# Patient Record
Sex: Female | Born: 1960 | Race: White | Hispanic: No | Smoking: Former smoker
Health system: Southern US, Community
[De-identification: ages and names within clinical notes are randomized; demographics above are authoritative.]

## PROBLEM LIST (undated history)

## (undated) ENCOUNTER — Emergency Department (HOSPITAL_COMMUNITY): Admission: EM | Payer: Managed Care, Other (non HMO)

## (undated) DIAGNOSIS — A6000 Herpesviral infection of urogenital system, unspecified: Secondary | ICD-10-CM

## (undated) DIAGNOSIS — I10 Essential (primary) hypertension: Secondary | ICD-10-CM

## (undated) DIAGNOSIS — B009 Herpesviral infection, unspecified: Secondary | ICD-10-CM

## (undated) DIAGNOSIS — K635 Polyp of colon: Secondary | ICD-10-CM

## (undated) DIAGNOSIS — R51 Headache: Secondary | ICD-10-CM

## (undated) DIAGNOSIS — R519 Headache, unspecified: Secondary | ICD-10-CM

## (undated) DIAGNOSIS — J302 Other seasonal allergic rhinitis: Secondary | ICD-10-CM

## (undated) DIAGNOSIS — E785 Hyperlipidemia, unspecified: Secondary | ICD-10-CM

## (undated) DIAGNOSIS — B019 Varicella without complication: Secondary | ICD-10-CM

## (undated) DIAGNOSIS — N946 Dysmenorrhea, unspecified: Secondary | ICD-10-CM

## (undated) DIAGNOSIS — N941 Unspecified dyspareunia: Secondary | ICD-10-CM

## (undated) DIAGNOSIS — N920 Excessive and frequent menstruation with regular cycle: Secondary | ICD-10-CM

## (undated) DIAGNOSIS — D219 Benign neoplasm of connective and other soft tissue, unspecified: Secondary | ICD-10-CM

## (undated) DIAGNOSIS — N952 Postmenopausal atrophic vaginitis: Secondary | ICD-10-CM

## (undated) DIAGNOSIS — N809 Endometriosis, unspecified: Secondary | ICD-10-CM

## (undated) HISTORY — DX: Varicella without complication: B01.9

## (undated) HISTORY — DX: Hyperlipidemia, unspecified: E78.5

## (undated) HISTORY — DX: Postmenopausal atrophic vaginitis: N95.2

## (undated) HISTORY — PX: TUBAL LIGATION: SHX77

## (undated) HISTORY — DX: Endometriosis, unspecified: N80.9

## (undated) HISTORY — DX: Benign neoplasm of connective and other soft tissue, unspecified: D21.9

## (undated) HISTORY — DX: Herpesviral infection, unspecified: B00.9

## (undated) HISTORY — DX: Polyp of colon: K63.5

## (undated) HISTORY — DX: Dysmenorrhea, unspecified: N94.6

## (undated) HISTORY — DX: Excessive and frequent menstruation with regular cycle: N92.0

## (undated) HISTORY — DX: Headache: R51

## (undated) HISTORY — DX: Unspecified dyspareunia: N94.10

## (undated) HISTORY — DX: Essential (primary) hypertension: I10

## (undated) HISTORY — DX: Other seasonal allergic rhinitis: J30.2

## (undated) HISTORY — DX: Herpesviral infection of urogenital system, unspecified: A60.00

## (undated) HISTORY — DX: Headache, unspecified: R51.9

---

## 1985-11-07 HISTORY — PX: KNEE DISLOCATION SURGERY: SHX689

## 1997-11-07 HISTORY — PX: ABDOMINAL HYSTERECTOMY: SHX81

## 2004-09-02 ENCOUNTER — Ambulatory Visit: Payer: Self-pay | Admitting: Obstetrics and Gynecology

## 2005-11-21 ENCOUNTER — Ambulatory Visit: Payer: Self-pay | Admitting: Obstetrics and Gynecology

## 2006-11-29 ENCOUNTER — Ambulatory Visit: Payer: Self-pay | Admitting: Obstetrics and Gynecology

## 2009-03-31 ENCOUNTER — Ambulatory Visit: Payer: Self-pay | Admitting: Obstetrics and Gynecology

## 2010-04-06 ENCOUNTER — Ambulatory Visit: Payer: Self-pay | Admitting: Obstetrics and Gynecology

## 2010-11-07 HISTORY — PX: OTHER SURGICAL HISTORY: SHX169

## 2011-05-18 ENCOUNTER — Ambulatory Visit: Payer: Self-pay | Admitting: Obstetrics and Gynecology

## 2011-12-11 LAB — HM COLONOSCOPY

## 2011-12-12 ENCOUNTER — Ambulatory Visit: Payer: Self-pay | Admitting: Gastroenterology

## 2012-05-22 ENCOUNTER — Ambulatory Visit: Payer: Self-pay | Admitting: Obstetrics and Gynecology

## 2012-05-28 ENCOUNTER — Ambulatory Visit: Payer: Self-pay | Admitting: Obstetrics and Gynecology

## 2012-06-08 ENCOUNTER — Ambulatory Visit: Payer: Self-pay | Admitting: Unknown Physician Specialty

## 2013-05-07 LAB — HM MAMMOGRAPHY

## 2013-05-28 ENCOUNTER — Ambulatory Visit: Payer: Self-pay | Admitting: Obstetrics and Gynecology

## 2013-09-24 ENCOUNTER — Ambulatory Visit: Payer: Self-pay | Admitting: Adult Health

## 2013-09-26 ENCOUNTER — Encounter: Payer: Self-pay | Admitting: Adult Health

## 2013-09-26 ENCOUNTER — Ambulatory Visit (INDEPENDENT_AMBULATORY_CARE_PROVIDER_SITE_OTHER): Payer: 59 | Admitting: Adult Health

## 2013-09-26 VITALS — BP 166/90 | HR 103 | Temp 97.8°F | Resp 14 | Ht 64.0 in | Wt 161.0 lb

## 2013-09-26 DIAGNOSIS — Z0000 Encounter for general adult medical examination without abnormal findings: Secondary | ICD-10-CM

## 2013-09-26 DIAGNOSIS — F341 Dysthymic disorder: Secondary | ICD-10-CM

## 2013-09-26 DIAGNOSIS — E7800 Pure hypercholesterolemia, unspecified: Secondary | ICD-10-CM

## 2013-09-26 DIAGNOSIS — R5383 Other fatigue: Secondary | ICD-10-CM

## 2013-09-26 DIAGNOSIS — I10 Essential (primary) hypertension: Secondary | ICD-10-CM

## 2013-09-26 DIAGNOSIS — F418 Other specified anxiety disorders: Secondary | ICD-10-CM | POA: Insufficient documentation

## 2013-09-26 DIAGNOSIS — R5381 Other malaise: Secondary | ICD-10-CM

## 2013-09-26 DIAGNOSIS — R079 Chest pain, unspecified: Secondary | ICD-10-CM

## 2013-09-26 MED ORDER — HYDROCHLOROTHIAZIDE 25 MG PO TABS: ORAL_TABLET | ORAL | Status: AC

## 2013-09-26 MED ORDER — LISINOPRIL 10 MG PO TABS: 10.0000 mg | ORAL_TABLET | Freq: Every day | ORAL | Status: AC

## 2013-09-26 MED ORDER — ALPRAZOLAM 0.25 MG PO TABS: ORAL_TABLET | ORAL | Status: AC

## 2013-09-26 MED ORDER — FLUOXETINE HCL 20 MG PO TABS: 20.0000 mg | ORAL_TABLET | Freq: Every day | ORAL | Status: AC

## 2013-09-26 NOTE — Progress Notes (Signed)
Pre visit review using our clinic review tool, if applicable. No additional management support is needed unless otherwise documented below in the visit note. 

## 2013-09-26 NOTE — Assessment & Plan Note (Addendum)
Her diagnosed with lymphoma in October 2013 and her son committed suicide in January 2014. She has attended grief counseling through hospice of [address]. Denies suicidal, homicidal ideations. Start Prozac 20 mg daily. Xanax 0.25 mg daily when necessary for anxiety/panic attack. Followup in one week. Note, greater than 60 minutes were spent in face-to-face communication with patient in the education, assessment, evaluation, planning and implementation of care pertaining to multiple problems discussed during visit.

## 2013-09-26 NOTE — Assessment & Plan Note (Signed)
Suspect symptoms may be related to depression. Check TSH and CBC

## 2013-09-26 NOTE — Assessment & Plan Note (Signed)
Patient reports chest tightness occurring when she feels anxious or having panic attack. EKG shows a normal sinus rhythm.

## 2013-09-26 NOTE — Progress Notes (Signed)
Subjective:    Patient ID: Carolyn Mann, female    DOB: 01/25/1961, 52 y.o.   MRN: [id number]  HPI  Patient is a pleasant 52 y/o female who presents to clinic to establish care. She was previously followed by Dr. Beckey Downing at [facility] in [place]. She was also followed by Dr. Logan Bores for her GYN. She was also followed by Dr. Lavenia Atlas. I will request medical records. Carolyn Mann reports that she has been going to a weight loss clinic. She has been taking phentermine and topamax. Recently, in October, she was noted to be hypertensive 161/112. The weight loss center recommended she be seen by PCP. Blood pressure has remained elevated.    Past Medical History  Diagnosis Date  . Chicken pox   . Frequent headaches   . Hyperlipidemia   . Hypertension   . Seasonal allergies   . HSV-2 (herpes simplex virus 2) infection   . Colon polyps     Colonoscopy 12/2011 - Repeat in 2023     Past Surgical History  Procedure Laterality Date  . Abdominal hysterectomy  1999    Cervix remains  . Knee dislocation surgery Left 1987  . Index finger bone spur Right 2012  . Left middle finger ganolin cyst Left 2012     Family History  Problem Relation Age of Onset  . Arthritis Mother   . Cancer Mother 24    breast cancer - died  . Hypertension Mother   . Cancer Maternal Grandmother   . Stroke Maternal Grandmother   . Cancer Other   . Hypertension Father   . Heart disease Father 51    CAD - MI died  . Lymphoma Daughter   . Depression Son   . Early death Son     suicide     History   Social History  . Marital Status: Divorced    Spouse Name: N/A    Number of Children: 3  . Years of Education: 13   Occupational History  . Project [employer]   Social History Main Topics  . Smoking status: Former Smoker -- 4 years    Quit date: 09/23/1985  . Smokeless tobacco: Not on file  . Alcohol Use: 0.6 oz/week    1 Glasses of wine per week  . Drug Use: No  . Sexual Activity: Not on file    Other Topics Concern  . Not on file   Social History Narrative   Carolyn Mann grew up in [place], [place]. She is divorced. She has 3 children (2 daughters and 1 son). Her son is deceased. She has 3 dogs. She rescues great danes. She also has 2 cats. Carolyn Mann has been working for [employer] for 19 years. She enjoys spending times with her dogs. She also enjoys photography and spending time with her grandchildren.      Review of Systems  Constitutional: Positive for appetite change and fatigue.  Respiratory: Positive for chest tightness.   Cardiovascular: Negative for palpitations and leg swelling.  Gastrointestinal: Negative.   Endocrine: Negative.   Genitourinary: Negative.   Musculoskeletal:       Joint stiffness. She has been evaluated by Dr. Gavin Potters   Skin: Negative.   Allergic/Immunologic:       Seasonal allergies. Cat allergies  Neurological: Positive for dizziness and headaches. Negative for tremors, seizures, syncope, speech difficulty and numbness.       Room spins with changing positions quickly  Psychiatric/Behavioral: Positive for sleep disturbance. Negative for suicidal ideas,  behavioral problems and agitation. The patient is nervous/anxious.        Depression for several years. Worsened with daughter's diagnosis of lymphoma and son's suicide in January 2014. Isolation.       Objective:   Physical Exam  Constitutional: She is oriented to person, place, and time. She appears well-developed and well-nourished. No distress.  Emotional distress  HENT:  Head: Normocephalic and atraumatic.  Right Ear: External ear normal.  Left Ear: External ear normal.  Nose: Nose normal.  Mouth/Throat: Oropharynx is clear and moist.  Eyes: Conjunctivae and EOM are normal. Pupils are equal, round, and reactive to light.  Neck: Normal range of motion. Neck supple. No tracheal deviation present. No thyromegaly present.  Cardiovascular: Normal rate, regular rhythm, normal heart sounds and intact  distal pulses.  Exam reveals no gallop and no friction rub.   No murmur heard. Pulmonary/Chest: Effort normal and breath sounds normal. No respiratory distress. She has no wheezes. She has no rales.  Abdominal: Soft. Bowel sounds are normal. She exhibits no distension and no mass. There is no tenderness. There is no rebound and no guarding.  Musculoskeletal: Normal range of motion. She exhibits no edema and no tenderness.  Lymphadenopathy:    She has no cervical adenopathy.  Neurological: She is alert and oriented to person, place, and time. She has normal reflexes. No cranial nerve deficit. Coordination normal.  Skin: Skin is warm and dry.  Psychiatric: She has a normal mood and affect. Her behavior is normal. Judgment and thought content normal.          Assessment & Plan:

## 2013-09-26 NOTE — Patient Instructions (Addendum)
Please return for fasting labs at your earliest convenience.  For your high blood pressure I am starting you on:   Lisinopril 10 mg daily  HCTZ (hydrochlorothiazide) 25 mg tablet take 1/2 tablet daily in the morning  For Depression with anxiety I am starting you on:   Prozac 20 mg daily in the morning  Xanax 0.25 mg daily as needed for panic attack  Return for follow up in 1 week

## 2013-09-26 NOTE — Assessment & Plan Note (Signed)
Normal physical exam excluding breast, pelvic/Pap which was done earlier this year. Check labs: CBC with differential, basic metabolic panel, hepatic panel, TSH, vitamin D, B12. EKG reveals normal sinus rhythm.

## 2013-09-26 NOTE — Assessment & Plan Note (Signed)
Check lipids 

## 2013-09-26 NOTE — Assessment & Plan Note (Signed)
Stop phentermine. Start lisinopril 10 mg and HCTZ 12.5 mg. Check basic metabolic panel. Return for followup in one week

## 2013-09-30 ENCOUNTER — Telehealth: Payer: Self-pay | Admitting: Adult Health

## 2013-09-30 NOTE — Telephone Encounter (Signed)
Appt tomorrow with Raquel 

## 2013-09-30 NOTE — Telephone Encounter (Signed)
Called pt, she was on the phone with the triage nurse, scheduling an appointment with Raquel for tomorrow

## 2013-09-30 NOTE — Telephone Encounter (Signed)
Return call placed to patient at [phone] for triage assessment for complaints of Rapid heart rate and headache since starting new medication. No answer. Voicemail message left for patient to return call to office number.

## 2013-09-30 NOTE — Telephone Encounter (Signed)
Patient Information:  Caller Name: [place]  Phone: [phone]  Patient: Carolyn Mann, Carolyn Mann  Gender: Female  DOB: November 28, 1960  Age: 52 Years  PCP: Orville Govern  Pregnant: No  Office Follow Up:  Does the office need to follow up with this patient?: No  Instructions For The Office: N/A  RN Note:  Posterior headache rated 7/10. Currently working. Currently scheduled for follow up 10/02/13.  Advised to see MD 10/01/13.  Will come in fasting for lab work.  Symptoms  Reason For Call & Symptoms: Posterior or frontal headache beginning 09/28/13, one day after beginning four new medications.  After shower this morning, felt like a "voodoo doll" with pin-like stabbing sensation all over body with nausea.  The prickling resolved with rest.  Coworker in lab noted irregular pulse in the 80's.  Reviewed Health History In EMR: Yes  Reviewed Medications In EMR: Yes  Reviewed Allergies In EMR: Yes  Reviewed Surgeries / Procedures: Yes  Date of Onset of Symptoms: 09/28/2013  Treatments Tried: Buffered ASA and Ibuprofen for headache  Treatments Tried Worked: Yes OB / GYN:  LMP: Unknown  Guideline(s) Used:  Headache  Disposition Per Guideline:   See Today or Tomorrow in Office  Reason For Disposition Reached:   Unexplained headache that is present > 24 hours  Advice Given:  Pain Medicines:  For pain relief, you can take either acetaminophen, ibuprofen, or naproxen.  Acetaminophen (e.g., Tylenol):  Regular Strength Tylenol: Take 650 mg (two 325 mg pills) by mouth every 4-6 hours as needed. Each Regular Strength Tylenol pill has 325 mg of acetaminophen.  Extra Strength Tylenol: Take 1,000 mg (two 500 mg pills) every 8 hours as needed. Each Extra Strength Tylenol pill has 500 mg of acetaminophen.  The most you should take each day is 3,000 mg (10 Regular Strength or 6 Extra Strength pills a day).  Naproxen (e.g., Aleve):  Take 220 mg (one 220 mg pill) by mouth every 8 hours as needed. You may take  440 mg (two 220 mg pills) for your first dose.  The most you should take each day is 660 mg (three 220 mg pills a day), unless your doctor has told you to take more.  Call Back If:  Headache lasts longer than 24 hours  You become worse.  Patient Will Follow Care Advice:  YES  Appointment Scheduled:  10/01/2013 09:15:00 Appointment Scheduled Provider:  Orville Govern

## 2013-10-01 ENCOUNTER — Ambulatory Visit (INDEPENDENT_AMBULATORY_CARE_PROVIDER_SITE_OTHER): Payer: 59 | Admitting: Adult Health

## 2013-10-01 ENCOUNTER — Ambulatory Visit: Payer: Self-pay | Admitting: Adult Health

## 2013-10-01 ENCOUNTER — Encounter: Payer: Self-pay | Admitting: Adult Health

## 2013-10-01 VITALS — BP 120/82 | HR 100 | Temp 97.8°F | Resp 14 | Wt 159.0 lb

## 2013-10-01 DIAGNOSIS — R519 Headache, unspecified: Secondary | ICD-10-CM | POA: Insufficient documentation

## 2013-10-01 DIAGNOSIS — F341 Dysthymic disorder: Secondary | ICD-10-CM

## 2013-10-01 DIAGNOSIS — I10 Essential (primary) hypertension: Secondary | ICD-10-CM

## 2013-10-01 DIAGNOSIS — F418 Other specified anxiety disorders: Secondary | ICD-10-CM

## 2013-10-01 DIAGNOSIS — Z0000 Encounter for general adult medical examination without abnormal findings: Secondary | ICD-10-CM

## 2013-10-01 DIAGNOSIS — R51 Headache: Secondary | ICD-10-CM

## 2013-10-01 LAB — LIPID PANEL
Cholesterol: 211 mg/dL — ABNORMAL HIGH (ref 0–200)
Total CHOL/HDL Ratio: 3
Triglycerides: 81 mg/dL (ref 0.0–149.0)

## 2013-10-01 LAB — CBC WITH DIFFERENTIAL/PLATELET
Basophils Absolute: 0.1 10*3/uL (ref 0.0–0.1)
Basophils Relative: 0.5 % (ref 0.0–3.0)
Eosinophils Absolute: 0.1 10*3/uL (ref 0.0–0.7)
Eosinophils Relative: 0.8 % (ref 0.0–5.0)
HCT: 43.9 % (ref 36.0–46.0)
Lymphocytes Relative: 23.4 % (ref 12.0–46.0)
Lymphs Abs: 2.5 10*3/uL (ref 0.7–4.0)
MCHC: 33.1 g/dL (ref 30.0–36.0)
MCV: 81.4 fl (ref 78.0–100.0)
Monocytes Absolute: 0.8 10*3/uL (ref 0.1–1.0)
Neutrophils Relative %: 67.9 % (ref 43.0–77.0)
Platelets: 465 10*3/uL — ABNORMAL HIGH (ref 150.0–400.0)
RDW: 14.2 % (ref 11.5–14.6)
WBC: 10.9 10*3/uL — ABNORMAL HIGH (ref 4.5–10.5)

## 2013-10-01 LAB — BASIC METABOLIC PANEL
CO2: 30 mEq/L (ref 19–32)
Calcium: 9.8 mg/dL (ref 8.4–10.5)
Chloride: 97 mEq/L (ref 96–112)
Glucose, Bld: 94 mg/dL (ref 70–99)
Sodium: 134 mEq/L — ABNORMAL LOW (ref 135–145)

## 2013-10-01 LAB — HEPATIC FUNCTION PANEL
ALT: 16 U/L (ref 0–35)
AST: 18 U/L (ref 0–37)
Alkaline Phosphatase: 74 U/L (ref 39–117)
Bilirubin, Direct: 0.2 mg/dL (ref 0.0–0.3)
Total Bilirubin: 1.1 mg/dL (ref 0.3–1.2)

## 2013-10-01 LAB — VITAMIN B12: Vitamin B-12: >1500 pg/mL — ABNORMAL HIGH (ref 211–911)

## 2013-10-01 LAB — HEMOGLOBIN A1C: Hgb A1c MFr Bld: 5.4 % (ref 4.6–6.5)

## 2013-10-01 NOTE — Assessment & Plan Note (Addendum)
Controlled on lisinopril 10 mg and HCTZ 12.5 mg. Continue meds. Bmet. Follow.

## 2013-10-01 NOTE — Progress Notes (Signed)
Pre visit review using our clinic review tool, if applicable. No additional management support is needed unless otherwise documented below in the visit note. 

## 2013-10-01 NOTE — Assessment & Plan Note (Signed)
Appears sinus related. Will try Dymista spray. Excedrin Migraine. Has been taking naproxen but wonder if having rebound HA. If no resolution consider scan prior to starting triptans.

## 2013-10-01 NOTE — Progress Notes (Signed)
  Subjective:    Patient ID: Carolyn Mann, female    DOB: 1961/04/05, 52 y.o.   MRN: [id number]  Headache  Associated symptoms include sinus pressure. Pertinent negatives include no fever or neck pain.    Pt is a pleasant 52 yo female, presents to clinic with reports of headache x 3 days.  She describes the headache as pressure in the front of her head radiating towards the back of her head.  Pt feels it is likely sinus pressure but has avoided decongestants due to her blood pressure.  She is also here for follow up HTN. She has been taking her medication daily. Also reports improvement with her xanax.   Current Outpatient Prescriptions on File Prior to Visit  Medication Sig Dispense Refill  . ALPRAZolam (XANAX) 0.25 MG tablet Take 1 tablet daily as needed for anxiety  30 tablet  0  . FLUoxetine (PROZAC) 20 MG tablet Take 1 tablet (20 mg total) by mouth daily.  30 tablet  3  . hydrochlorothiazide (HYDRODIURIL) 25 MG tablet Take 1/2 tablet daily.  30 tablet  3  . lisinopril (PRINIVIL,ZESTRIL) 10 MG tablet Take 1 tablet (10 mg total) by mouth daily.  30 tablet  3  . topiramate (TOPAMAX) 50 MG tablet Take 50 mg by mouth daily.        No current facility-administered medications on file prior to visit.     Review of Systems  Constitutional: Negative for fever and chills.  HENT: Positive for sinus pressure.   Musculoskeletal: Negative for neck pain and neck stiffness.  Neurological: Positive for headaches.  Psychiatric/Behavioral: The patient is nervous/anxious.        Improved anxiety       Objective:   Physical Exam  Constitutional: She is oriented to person, place, and time. She appears well-developed and well-nourished. No distress.  HENT:  Nose: Right sinus exhibits frontal sinus tenderness. Left sinus exhibits frontal sinus tenderness.  Eyes: Conjunctivae and EOM are normal. Pupils are equal, round, and reactive to light.  Cardiovascular: Normal rate, regular rhythm and intact  distal pulses.  Exam reveals no gallop and no friction rub.   No murmur heard. Pulmonary/Chest: Effort normal and breath sounds normal. No respiratory distress. She has no wheezes. She has no rales.  Neurological: She is alert and oriented to person, place, and time.  Psychiatric: She has a normal mood and affect. Her speech is normal and behavior is normal. Thought content normal. Cognition and memory are normal.    BP 120/82  Pulse 100  Temp(Src) 97.8 F (36.6 C) (Oral)  Resp 14  Wt 159 lb (72.122 kg)  SpO2 98%       Assessment & Plan:

## 2013-10-01 NOTE — Assessment & Plan Note (Signed)
Patient reports anxiety improved. Is taking xanax daily. Will try to take only prn. Continue to follow.

## 2013-10-01 NOTE — Patient Instructions (Signed)
  Start Dymista nasal spray - one spray into each nostril twice a day.  Continue sinus irrigation.  Try Excedrin migraine. You may take it with a cup of coffee.  If headaches are not resolved or persist we will consider doing a scan.

## 2013-10-02 ENCOUNTER — Ambulatory Visit: Payer: 59 | Admitting: Adult Health

## 2013-10-02 LAB — LDL CHOLESTEROL, DIRECT: Direct LDL: 125 mg/dL

## 2013-10-07 ENCOUNTER — Ambulatory Visit (INDEPENDENT_AMBULATORY_CARE_PROVIDER_SITE_OTHER): Payer: 59 | Admitting: Adult Health

## 2013-10-07 ENCOUNTER — Encounter: Payer: Self-pay | Admitting: Adult Health

## 2013-10-07 VITALS — BP 122/80 | HR 90 | Temp 98.5°F | Resp 16 | Wt 163.0 lb

## 2013-10-07 DIAGNOSIS — R899 Unspecified abnormal finding in specimens from other organs, systems and tissues: Secondary | ICD-10-CM

## 2013-10-07 DIAGNOSIS — R6889 Other general symptoms and signs: Secondary | ICD-10-CM

## 2013-10-07 DIAGNOSIS — R42 Dizziness and giddiness: Secondary | ICD-10-CM | POA: Insufficient documentation

## 2013-10-07 LAB — BASIC METABOLIC PANEL
CO2: 29 mEq/L (ref 19–32)
Calcium: 9.6 mg/dL (ref 8.4–10.5)
GFR: 60.59 mL/min (ref >60.00)
Glucose, Bld: 86 mg/dL (ref 70–99)
Potassium: 4 mEq/L (ref 3.5–5.1)
Sodium: 138 mEq/L (ref 135–145)

## 2013-10-07 NOTE — Patient Instructions (Signed)
  Hold the fluid pill (HCTZ) for now.  Continue to take the lisinopril 10 mg daily. If symptoms continue, try cutting this pill in half.  Please have your blood work drawn prior to leaving the office.  I will notify you once the results are available.

## 2013-10-07 NOTE — Progress Notes (Signed)
Pre visit review using our clinic review tool, if applicable. No additional management support is needed unless otherwise documented below in the visit note. 

## 2013-10-07 NOTE — Assessment & Plan Note (Signed)
Hold HCTZ. Continue lisinopril 10 mg. Check bmet. If symptoms persist will try lisinopril 5 mg daily.

## 2013-10-07 NOTE — Progress Notes (Signed)
   Subjective:    Patient ID: Carolyn Mann, female    DOB: 1961/06/03, 52 y.o.   MRN: [id number]  HPI  Patient is a pleasant 52 y/o female with hx of HTN recently started on lisinopril 10 mg and HCTZ 12.5 mg. She has been experiencing dizziness and feeling like she is going to pass out. Her electrolytes had shown a slightly low sodium and mild dehydration. Her blood pressure has been doing very well.  Current Outpatient Prescriptions on File Prior to Visit  Medication Sig Dispense Refill  . ALPRAZolam (XANAX) 0.25 MG tablet Take 1 tablet daily as needed for anxiety  30 tablet  0  . FLUoxetine (PROZAC) 20 MG tablet Take 1 tablet (20 mg total) by mouth daily.  30 tablet  3  . hydrochlorothiazide (HYDRODIURIL) 25 MG tablet Take 1/2 tablet daily.  30 tablet  3  . lisinopril (PRINIVIL,ZESTRIL) 10 MG tablet Take 1 tablet (10 mg total) by mouth daily.  30 tablet  3  . topiramate (TOPAMAX) 50 MG tablet Take 50 mg by mouth daily.        No current facility-administered medications on file prior to visit.     Review of Systems  Neurological: Positive for dizziness, light-headedness and headaches.       Objective:   Physical Exam  Constitutional: She is oriented to person, place, and time. She appears well-developed and well-nourished. No distress.  Cardiovascular: Normal rate, regular rhythm, normal heart sounds and intact distal pulses.  Exam reveals no gallop and no friction rub.   No murmur heard. Pulmonary/Chest: Effort normal and breath sounds normal. No respiratory distress. She has no wheezes. She has no rales.  Neurological: She is alert and oriented to person, place, and time.  Skin: Skin is warm and dry.  Psychiatric: She has a normal mood and affect. Her behavior is normal. Judgment and thought content normal.          Assessment & Plan:

## 2014-01-16 ENCOUNTER — Ambulatory Visit: Payer: Self-pay | Admitting: Pain Medicine

## 2014-02-03 ENCOUNTER — Ambulatory Visit: Payer: Self-pay | Admitting: Pain Medicine

## 2015-03-04 ENCOUNTER — Other Ambulatory Visit: Payer: Self-pay | Admitting: Obstetrics and Gynecology

## 2015-03-04 DIAGNOSIS — Z1231 Encounter for screening mammogram for malignant neoplasm of breast: Secondary | ICD-10-CM

## 2015-03-19 ENCOUNTER — Ambulatory Visit
Admission: RE | Admit: 2015-03-19 | Discharge: 2015-03-19 | Disposition: A | Discharge status: Home / Self Care | Payer: 59 | Source: Ambulatory Visit | Attending: Obstetrics and Gynecology | Admitting: Obstetrics and Gynecology

## 2015-03-19 DIAGNOSIS — Z1231 Encounter for screening mammogram for malignant neoplasm of breast: Secondary | ICD-10-CM | POA: Diagnosis present

## 2016-02-24 ENCOUNTER — Encounter: Payer: Self-pay | Admitting: Obstetrics and Gynecology

## 2017-02-14 ENCOUNTER — Other Ambulatory Visit: Payer: Self-pay | Admitting: Neurology

## 2017-02-14 DIAGNOSIS — R531 Weakness: Secondary | ICD-10-CM

## 2017-02-14 DIAGNOSIS — R200 Anesthesia of skin: Secondary | ICD-10-CM

## 2017-02-23 ENCOUNTER — Ambulatory Visit
Admission: RE | Admit: 2017-02-23 | Discharge: 2017-02-23 | Disposition: A | Discharge status: Home / Self Care | Payer: 59 | Source: Ambulatory Visit | Attending: Neurology | Admitting: Neurology

## 2017-02-23 DIAGNOSIS — R600 Localized edema: Secondary | ICD-10-CM | POA: Diagnosis not present

## 2017-02-23 DIAGNOSIS — R200 Anesthesia of skin: Secondary | ICD-10-CM | POA: Diagnosis not present

## 2017-02-23 DIAGNOSIS — R531 Weakness: Secondary | ICD-10-CM | POA: Insufficient documentation

## 2017-03-14 ENCOUNTER — Encounter (INDEPENDENT_AMBULATORY_CARE_PROVIDER_SITE_OTHER): Payer: 59 | Admitting: Vascular Surgery

## 2017-03-23 ENCOUNTER — Other Ambulatory Visit: Payer: Self-pay | Admitting: Physician Assistant

## 2017-03-23 DIAGNOSIS — Z1231 Encounter for screening mammogram for malignant neoplasm of breast: Secondary | ICD-10-CM

## 2017-04-21 ENCOUNTER — Ambulatory Visit
Admission: RE | Admit: 2017-04-21 | Discharge: 2017-04-21 | Disposition: A | Discharge status: Home / Self Care | Payer: 59 | Source: Ambulatory Visit | Attending: Physician Assistant | Admitting: Physician Assistant

## 2017-04-21 DIAGNOSIS — Z1231 Encounter for screening mammogram for malignant neoplasm of breast: Secondary | ICD-10-CM

## 2018-05-29 ENCOUNTER — Other Ambulatory Visit: Payer: Self-pay | Admitting: Physician Assistant

## 2018-05-29 DIAGNOSIS — Z1231 Encounter for screening mammogram for malignant neoplasm of breast: Secondary | ICD-10-CM

## 2018-06-20 ENCOUNTER — Ambulatory Visit
Admission: RE | Admit: 2018-06-20 | Discharge: 2018-06-20 | Disposition: A | Discharge status: Home / Self Care | Payer: Managed Care, Other (non HMO) | Source: Ambulatory Visit | Attending: Physician Assistant | Admitting: Physician Assistant

## 2018-06-20 DIAGNOSIS — Z1231 Encounter for screening mammogram for malignant neoplasm of breast: Secondary | ICD-10-CM | POA: Diagnosis present

## 2018-11-30 ENCOUNTER — Other Ambulatory Visit (HOSPITAL_COMMUNITY): Payer: Self-pay | Admitting: Physical Medicine and Rehabilitation

## 2018-11-30 ENCOUNTER — Other Ambulatory Visit: Payer: Self-pay | Admitting: Physical Medicine and Rehabilitation

## 2018-11-30 DIAGNOSIS — M5416 Radiculopathy, lumbar region: Secondary | ICD-10-CM

## 2018-12-08 ENCOUNTER — Ambulatory Visit
Admission: RE | Admit: 2018-12-08 | Discharge: 2018-12-08 | Disposition: A | Discharge status: Home / Self Care | Payer: 59 | Source: Ambulatory Visit | Attending: Physical Medicine and Rehabilitation | Admitting: Physical Medicine and Rehabilitation

## 2018-12-08 DIAGNOSIS — M5416 Radiculopathy, lumbar region: Secondary | ICD-10-CM | POA: Diagnosis not present

## 2019-04-08 ENCOUNTER — Other Ambulatory Visit: Payer: Self-pay | Admitting: Physician Assistant

## 2019-04-08 DIAGNOSIS — Z1231 Encounter for screening mammogram for malignant neoplasm of breast: Secondary | ICD-10-CM

## 2020-01-10 ENCOUNTER — Ambulatory Visit: Payer: Managed Care, Other (non HMO) | Attending: Internal Medicine

## 2020-01-10 DIAGNOSIS — Z23 Encounter for immunization: Secondary | ICD-10-CM | POA: Insufficient documentation

## 2020-01-10 NOTE — Progress Notes (Signed)
   Covid-19 Vaccination Clinic  Name:  VENOLA FELLING    MRN: [id number] DOB: [birth year]  01/10/2020  Ms. Behrle was observed post Covid-19 immunization for 15 minutes without incident. She was provided with Vaccine Information Sheet and instruction to access the V-Safe system.   Ms. Takada was instructed to call 911 with any severe reactions post vaccine: Marland Kitchen Difficulty breathing  . Swelling of face and throat  . A fast heartbeat  . A bad rash all over body  . Dizziness and weakness   Immunizations Administered    Name Date Dose VIS Date Route   Pfizer COVID-19 Vaccine 01/10/2020 10:36 AM 0.3 mL 10/18/2019 Intramuscular   Manufacturer: [place]   Lot: [id number]   [place]: [id number]

## 2020-01-31 ENCOUNTER — Ambulatory Visit: Payer: 59 | Attending: Internal Medicine

## 2020-01-31 DIAGNOSIS — Z23 Encounter for immunization: Secondary | ICD-10-CM

## 2020-01-31 NOTE — Progress Notes (Signed)
   Covid-19 Vaccination Clinic  Name:  MERIDITH TOWELL    MRN: [id number] DOB: 01-20-1961  01/31/2020  Ms. Curtain was observed post Covid-19 immunization for 15 minutes without incident. She was provided with Vaccine Information Sheet and instruction to access the V-Safe system.   Ms. Gaal was instructed to call 911 with any severe reactions post vaccine: Marland Kitchen Difficulty breathing  . Swelling of face and throat  . A fast heartbeat  . A bad rash all over body  . Dizziness and weakness   Immunizations Administered    Name Date Dose VIS Date Route   Pfizer COVID-19 Vaccine 01/31/2020  9:03 AM 0.3 mL 10/18/2019 Intramuscular   Manufacturer: [place]   Lot: [id number]   [place]: [id number]

## 2021-12-14 ENCOUNTER — Other Ambulatory Visit: Payer: Self-pay | Admitting: Physician Assistant

## 2021-12-14 DIAGNOSIS — Z1231 Encounter for screening mammogram for malignant neoplasm of breast: Secondary | ICD-10-CM

## 2021-12-28 ENCOUNTER — Other Ambulatory Visit: Payer: Self-pay

## 2021-12-28 ENCOUNTER — Ambulatory Visit
Admission: RE | Admit: 2021-12-28 | Discharge: 2021-12-28 | Disposition: A | Discharge status: Home / Self Care | Payer: No Typology Code available for payment source | Source: Ambulatory Visit | Attending: Physician Assistant | Admitting: Physician Assistant

## 2021-12-28 DIAGNOSIS — Z1231 Encounter for screening mammogram for malignant neoplasm of breast: Secondary | ICD-10-CM | POA: Diagnosis not present

## 2022-12-05 ENCOUNTER — Ambulatory Visit
Admission: RE | Admit: 2022-12-05 | Discharge: 2022-12-05 | Disposition: A | Discharge status: Home / Self Care | Payer: No Typology Code available for payment source | Attending: Gastroenterology | Admitting: Gastroenterology

## 2022-12-05 ENCOUNTER — Encounter
Admission: RE | Disposition: A | Discharge status: Home / Self Care | Payer: Self-pay | Source: Home / Self Care | Attending: Gastroenterology

## 2022-12-05 ENCOUNTER — Encounter: Payer: Self-pay | Admitting: Gastroenterology

## 2022-12-05 ENCOUNTER — Ambulatory Visit: Payer: No Typology Code available for payment source | Admitting: General Practice

## 2022-12-05 DIAGNOSIS — Z1211 Encounter for screening for malignant neoplasm of colon: Secondary | ICD-10-CM | POA: Insufficient documentation

## 2022-12-05 DIAGNOSIS — Z8619 Personal history of other infectious and parasitic diseases: Secondary | ICD-10-CM | POA: Diagnosis not present

## 2022-12-05 DIAGNOSIS — Z8379 Family history of other diseases of the digestive system: Secondary | ICD-10-CM | POA: Diagnosis not present

## 2022-12-05 DIAGNOSIS — K5730 Diverticulosis of large intestine without perforation or abscess without bleeding: Secondary | ICD-10-CM | POA: Insufficient documentation

## 2022-12-05 DIAGNOSIS — I10 Essential (primary) hypertension: Secondary | ICD-10-CM | POA: Insufficient documentation

## 2022-12-05 DIAGNOSIS — Z90710 Acquired absence of both cervix and uterus: Secondary | ICD-10-CM | POA: Insufficient documentation

## 2022-12-05 DIAGNOSIS — K640 First degree hemorrhoids: Secondary | ICD-10-CM | POA: Insufficient documentation

## 2022-12-05 DIAGNOSIS — D124 Benign neoplasm of descending colon: Secondary | ICD-10-CM | POA: Insufficient documentation

## 2022-12-05 DIAGNOSIS — Z86010 Personal history of colonic polyps: Secondary | ICD-10-CM | POA: Insufficient documentation

## 2022-12-05 DIAGNOSIS — E785 Hyperlipidemia, unspecified: Secondary | ICD-10-CM | POA: Insufficient documentation

## 2022-12-05 DIAGNOSIS — Z800 Family history of malignant neoplasm of digestive organs: Secondary | ICD-10-CM | POA: Diagnosis not present

## 2022-12-05 DIAGNOSIS — D122 Benign neoplasm of ascending colon: Secondary | ICD-10-CM | POA: Insufficient documentation

## 2022-12-05 HISTORY — PX: COLONOSCOPY WITH PROPOFOL: SHX5780

## 2022-12-05 SURGERY — COLONOSCOPY WITH PROPOFOL
Anesthesia: General

## 2022-12-05 MED ORDER — PROPOFOL 500 MG/50ML IV EMUL
INTRAVENOUS | Status: DC | PRN
  Administered 2022-12-05: 150 ug/kg/min via INTRAVENOUS

## 2022-12-05 MED ORDER — PROPOFOL 1000 MG/100ML IV EMUL
INTRAVENOUS | Status: AC
  Filled 2022-12-05: qty 100

## 2022-12-05 MED ORDER — LIDOCAINE HCL (CARDIAC) PF 100 MG/5ML IV SOSY
PREFILLED_SYRINGE | INTRAVENOUS | Status: DC | PRN
  Administered 2022-12-05: 100 mg via INTRAVENOUS

## 2022-12-05 MED ORDER — PROPOFOL 10 MG/ML IV BOLUS
INTRAVENOUS | Status: DC | PRN
  Administered 2022-12-05: 20 mg via INTRAVENOUS
  Administered 2022-12-05: 70 mg via INTRAVENOUS

## 2022-12-05 MED ORDER — EPHEDRINE 5 MG/ML INJ
INTRAVENOUS | Status: AC
  Filled 2022-12-05: qty 5

## 2022-12-05 MED ORDER — SODIUM CHLORIDE 0.9 % IV SOLN: INTRAVENOUS | Status: DC

## 2022-12-05 MED ORDER — LIDOCAINE HCL (PF) 2 % IJ SOLN
INTRAMUSCULAR | Status: AC
  Filled 2022-12-05: qty 5

## 2022-12-05 NOTE — Transfer of Care (Signed)
Immediate Anesthesia Transfer of Care Note  Patient: Carolyn Mann  Procedure(s) Performed: COLONOSCOPY WITH PROPOFOL  Patient Location: PACU  Anesthesia Type:General  Level of Consciousness: awake, alert , and oriented  Airway & Oxygen Therapy: Patient Spontanous Breathing  Post-op Assessment: Report given to RN and Post -op Vital signs reviewed and stable  Post vital signs: Reviewed and stable  Last Vitals:  Vitals Value Taken Time  BP    Temp    Pulse    Resp    SpO2      Last Pain:  Vitals:   12/05/22 1252  TempSrc: Temporal  PainSc: 0-No pain         Complications: No notable events documented.

## 2022-12-05 NOTE — Op Note (Signed)
[facility] Patient Name: Carolyn Mann Procedure Date: 12/05/2022 1:59 PM MRN: [id number] Account #: [id number] Date of Birth: 03/30/1961 Admit Type: Outpatient Age: 62 Room: [facility] ENDO ROOM 2 Gender: Female Note Status: Finalized Instrument Name: Peds Colonoscope [id number] Procedure:             Colonoscopy Indications:           High risk colon cancer surveillance: Personal history                         of colonic polyps Providers:             Rueben Bash, DO Referring MD:          Annamaria Helling DO, DO (Referring MD), Precious Bard, MD (Referring MD) Medicines:             Monitored Anesthesia Care Complications:         No immediate complications. Estimated blood loss:                         Minimal. Procedure:             Pre-Anesthesia Assessment:                        - Prior to the procedure, a History and Physical was                         performed, and patient medications and allergies were                         reviewed. The patient is competent. The risks and                         benefits of the procedure and the sedation options and                         risks were discussed with the patient. All questions                         were answered and informed consent was obtained.                         Patient identification and proposed procedure were                         verified by the physician, the nurse, the anesthetist                         and the technician in the endoscopy suite. Mental                         Status Examination: alert and oriented. Airway                         Examination: normal oropharyngeal airway and neck  mobility. Respiratory Examination: clear to                         auscultation. CV Examination: RRR, no murmurs, no S3                         or S4. Prophylactic Antibiotics: The patient does not                          require prophylactic antibiotics. Prior                         Anticoagulants: The patient has taken no anticoagulant                         or antiplatelet agents. ASA Grade Assessment: II - A                         patient with mild systemic disease. After reviewing                         the risks and benefits, the patient was deemed in                         satisfactory condition to undergo the procedure. The                         anesthesia plan was to use monitored anesthesia care                         (MAC). Immediately prior to administration of                         medications, the patient was re-assessed for adequacy                         to receive sedatives. The heart rate, respiratory                         rate, oxygen saturations, blood pressure, adequacy of                         pulmonary ventilation, and response to care were                         monitored throughout the procedure. The physical                         status of the patient was re-assessed after the                         procedure.                        After obtaining informed consent, the colonoscope was                         passed under direct vision. Throughout the procedure,  the patient's blood pressure, pulse, and oxygen                         saturations were monitored continuously. The                         Colonoscope was introduced through the anus and                         advanced to the the terminal ileum, with                         identification of the appendiceal orifice and IC                         valve. The colonoscopy was performed without                         difficulty. The patient tolerated the procedure well.                         The quality of the bowel preparation was evaluated                         using the BBPS [facility] Bowel Preparation Scale) with                         scores of: Right Colon = 3,  Transverse Colon = 3 and                         Left Colon = 3 (entire mucosa seen well with no                         residual staining, small fragments of stool or opaque                         liquid). The total BBPS score equals 9. The terminal                         ileum, ileocecal valve, appendiceal orifice, and                         rectum were photographed. Findings:      The perianal and digital rectal examinations were normal. Pertinent       negatives include normal sphincter tone.      The terminal ileum appeared normal. Estimated blood loss: none.      Multiple small-mouthed diverticula were found in the left colon.       Estimated blood loss: none.      Non-bleeding internal hemorrhoids were found during retroflexion. The       hemorrhoids were Grade I (internal hemorrhoids that do not prolapse).       Estimated blood loss: none.      Two sessile polyps were found in the descending colon. The polyps were 1       to 2 mm in size. These polyps were removed with a jumbo cold forceps.       Resection and retrieval were complete. Estimated blood  loss was minimal.      A 3 to 4 mm polyp was found in the ascending colon. The polyp was       sessile. The polyp was removed with a cold snare. Resection and       retrieval were complete. Estimated blood loss was minimal.      The exam was otherwise without abnormality on direct and retroflexion       views. Impression:            - The examined portion of the ileum was normal.                        - Diverticulosis in the left colon.                        - Non-bleeding internal hemorrhoids.                        - Two 1 to 2 mm polyps in the descending colon,                         removed with a jumbo cold forceps. Resected and                         retrieved.                        - One 3 to 4 mm polyp in the ascending colon, removed                         with a cold snare. Resected and retrieved.                         - The examination was otherwise normal on direct and                         retroflexion views. Recommendation:        - Patient has a contact number available for                         emergencies. The signs and symptoms of potential                         delayed complications were discussed with the patient.                         Return to normal activities tomorrow. Written                         discharge instructions were provided to the patient.                        - Discharge patient to home.                        - Resume previous diet.                        - Continue present medications.                        -  No aspirin, ibuprofen, naproxen, or other                         non-steroidal anti-inflammatory drugs for 5 days after                         polyp removal.                        - Await pathology results.                        - Repeat colonoscopy for surveillance based on                         pathology results.                        - Return to referring physician as previously                         scheduled.                        - The findings and recommendations were discussed with                         the patient. Procedure Code(s):     --- Professional ---                        [phone], Colonoscopy, flexible; with removal of                         tumor(s), polyp(s), or other lesion(s) by snare                         technique                        [id number], 31, Colonoscopy, flexible; with biopsy, single                         or multiple Diagnosis Code(s):     --- Professional ---                        Z86.010, Personal history of colonic polyps                        K64.0, First degree hemorrhoids                        D12.4, Benign neoplasm of descending colon                        D12.2, Benign neoplasm of ascending colon                        K57.30, Diverticulosis of large intestine without                          perforation or abscess without bleeding CPT copyright 2022 American Medical Association. All rights reserved. The  codes documented in this report are preliminary and upon coder review may  be revised to meet current compliance requirements. Attending Participation:      I personally performed the entire procedure. Volney American, DO Annamaria Helling DO, DO 12/05/2022 3:05:52 PM This report has been signed electronically. Number of Addenda: 0 Note Initiated On: 12/05/2022 1:59 PM Scope Withdrawal Time: 0 hours 16 minutes 6 seconds  Total Procedure Duration: 0 hours 19 minutes 35 seconds  Estimated Blood Loss:  Estimated blood loss was minimal.      [facility]

## 2022-12-05 NOTE — H&P (Signed)
Pre-Procedure H&P   Patient ID: Carolyn Mann is a 62 y.o. female.  Gastroenterology Provider: Annamaria Helling, DO  Referring Provider: Laurine Blazer, PA PCP: Caren Macadam, MD  Date: 12/05/2022  HPI [name] is a 62 y.o. female who presents today for Colonoscopy for Surveillance-personal history of colon polyps .  Patient last underwent colonoscopy in February 2013 with fair prep and 1 tubular adenoma in the ascending colon.  Sigmoid diverticulosis was noted.  Patient reports 2 bowel movements per day without melena or hematochezia.  She avoids trigger foods that can cause more urgent bowel movements  Patient is status post hysterectomy.  Most recent hemoglobin 13.2 MCV 82 platelets 398,000  Paternal grandmother with gastric cancer.  Mother with diverticulosis.  No family history of colon cancer or colon polyps   Past Medical History:  Diagnosis Date   Chicken pox    Colon polyps    Colonoscopy 12/2011 - Repeat in 2023   Dysmenorrhea    Dyspareunia in female    Endometriosis    Fibroid    Frequent headaches    Herpes genitalia    HSV-2 (herpes simplex virus 2) infection    Hyperlipidemia    Hypertension    Menorrhagia    Seasonal allergies    Vaginal atrophy     Past Surgical History:  Procedure Laterality Date   ABDOMINAL HYSTERECTOMY  1999   Cervix remains   index finger bone spur Right 2012   KNEE DISLOCATION SURGERY Left 1987   left middle finger ganolin cyst Left 2012   TUBAL LIGATION      Family History Paternal grandmother with gastric cancer.  Mother with diverticulosis.  No family history of colon cancer or colon polyps No h/o GI disease or malignancy  Review of Systems  Constitutional:  Negative for activity change, appetite change, chills, diaphoresis, fatigue, fever and unexpected weight change.  HENT:  Negative for trouble swallowing and voice change.   Respiratory:  Negative for shortness of breath and wheezing.    Cardiovascular:  Negative for chest pain, palpitations and leg swelling.  Gastrointestinal:  Negative for abdominal distention, abdominal pain, anal bleeding, blood in stool, constipation, diarrhea, nausea, rectal pain and vomiting.  Musculoskeletal:  Negative for arthralgias and myalgias.  Skin:  Negative for color change and pallor.  Neurological:  Negative for dizziness, syncope and weakness.  Psychiatric/Behavioral:  Negative for confusion.   All other systems reviewed and are negative.    Medications No current facility-administered medications on file prior to encounter.   Current Outpatient Medications on File Prior to Encounter  Medication Sig Dispense Refill   hydrochlorothiazide (HYDRODIURIL) 25 MG tablet Take 1/2 tablet daily. 30 tablet 3   lisinopril (PRINIVIL,ZESTRIL) 10 MG tablet Take 1 tablet (10 mg total) by mouth daily. 30 tablet 3   ALPRAZolam (XANAX) 0.25 MG tablet Take 1 tablet daily as needed for anxiety 30 tablet 0   FLUoxetine (PROZAC) 20 MG tablet Take 1 tablet (20 mg total) by mouth daily. 30 tablet 3   topiramate (TOPAMAX) 50 MG tablet Take 50 mg by mouth daily.       Pertinent medications related to GI and procedure were reviewed by me with the patient prior to the procedure   Current Facility-Administered Medications:    0.9 %  sodium chloride infusion, , Intravenous, Continuous, Annamaria Helling, DO, Last Rate: 20 mL/hr at 12/05/22 1401, Continued from Pre-op at 12/05/22 1401      Allergies  Allergen  Reactions   Codeine Other (See Comments)   Latex Other (See Comments)   Penicillins    Allergies were reviewed by me prior to the procedure  Objective   Body mass index is 39.96 kg/m. Vitals:   12/05/22 1252  BP: 127/65  Pulse: 94  Resp: 16  Temp: (!) 96.3 F (35.7 C)  TempSrc: Temporal  SpO2: 100%  Weight: 104.8 kg  Height: 5' 3.75" (1.619 m)     Physical Exam Vitals and nursing note reviewed.  Constitutional:      General:  She is not in acute distress.    Appearance: Normal appearance. She is obese. She is not ill-appearing, toxic-appearing or diaphoretic.  HENT:     Head: Normocephalic and atraumatic.     Nose: Nose normal.     Mouth/Throat:     Mouth: Mucous membranes are moist.     Pharynx: Oropharynx is clear.  Eyes:     General: No scleral icterus.    Extraocular Movements: Extraocular movements intact.  Cardiovascular:     Rate and Rhythm: Normal rate and regular rhythm.     Heart sounds: Normal heart sounds. No murmur heard.    No friction rub. No gallop.  Pulmonary:     Effort: Pulmonary effort is normal. No respiratory distress.     Breath sounds: Normal breath sounds. No wheezing, rhonchi or rales.  Abdominal:     General: Abdomen is flat. Bowel sounds are normal. There is no distension.     Palpations: Abdomen is soft.     Tenderness: There is no abdominal tenderness. There is no guarding or rebound.  Musculoskeletal:     Cervical back: Neck supple.     Right lower leg: No edema.     Left lower leg: No edema.  Skin:    General: Skin is warm and dry.     Coloration: Skin is not jaundiced or pale.  Neurological:     General: No focal deficit present.     Mental Status: She is alert and oriented to person, place, and time. Mental status is at baseline.  Psychiatric:        Mood and Affect: Mood normal.        Behavior: Behavior normal.        Thought Content: Thought content normal.        Judgment: Judgment normal.      Assessment:  [name] is a 62 y.o. female  who presents today for Colonoscopy for surveillance- phx colon polyps.  Plan:  Colonoscopy with possible intervention today  Colonoscopy with possible biopsy, control of bleeding, polypectomy, and interventions as necessary has been discussed with the patient/patient representative. Informed consent was obtained from the patient/patient representative after explaining the indication, nature, and risks of the  procedure including but not limited to death, bleeding, perforation, missed neoplasm/lesions, cardiorespiratory compromise, and reaction to medications. Opportunity for questions was given and appropriate answers were provided. Patient/patient representative has verbalized understanding is amenable to undergoing the procedure.   Annamaria Helling, DO  [facility]  Portions of the record may have been created with voice recognition software. Occasional wrong-word or 'sound-a-like' substitutions may have occurred due to the inherent limitations of voice recognition software.  Read the chart carefully and recognize, using context, where substitutions may have occurred.

## 2022-12-05 NOTE — Anesthesia Preprocedure Evaluation (Signed)
Anesthesia Evaluation  Patient identified by MRN, date of birth, ID band Patient awake    Reviewed: Allergy & Precautions, NPO status , Patient's Chart, lab work & pertinent test results  History of Anesthesia Complications Negative for: history of anesthetic complications  Airway Mallampati: III  TM Distance: >3 FB Neck ROM: full    Dental  (+) Chipped, Dental Advidsory Given   Pulmonary neg pulmonary ROS, neg COPD, former smoker   Pulmonary exam normal        Cardiovascular hypertension, On Medications (-) Past MI and (-) CABG negative cardio ROS Normal cardiovascular exam     Neuro/Psych  PSYCHIATRIC DISORDERS Anxiety Depression    negative neurological ROS     GI/Hepatic negative GI ROS, Neg liver ROS,,,  Endo/Other  negative endocrine ROS    Renal/GU negative Renal ROS  negative genitourinary   Musculoskeletal   Abdominal   Peds  Hematology negative hematology ROS (+)   Anesthesia Other Findings Past Medical History: No date: Chicken pox No date: Colon polyps     Comment:  Colonoscopy 12/2011 - Repeat in 2023 No date: Dysmenorrhea No date: Dyspareunia in female No date: Endometriosis No date: Fibroid No date: Frequent headaches No date: Herpes genitalia No date: HSV-2 (herpes simplex virus 2) infection No date: Hyperlipidemia No date: Hypertension No date: Menorrhagia No date: Seasonal allergies No date: Vaginal atrophy  Past Surgical History: 1999: ABDOMINAL HYSTERECTOMY     Comment:  Cervix remains 2012: index finger bone spur; Right 1987: KNEE DISLOCATION SURGERY; Left 2012: left middle finger ganolin cyst; Left No date: TUBAL LIGATION  BMI    Body Mass Index: 39.96 kg/m      Reproductive/Obstetrics negative OB ROS                             Anesthesia Physical Anesthesia Plan  ASA: 2  Anesthesia Plan: General   Post-op Pain Management: Minimal or no  pain anticipated   Induction: Intravenous  PONV Risk Score and Plan: 3 and Propofol infusion, TIVA and Ondansetron  Airway Management Planned: Nasal Cannula  Additional Equipment: None  Intra-op Plan:   Post-operative Plan:   Informed Consent: I have reviewed the patients History and Physical, chart, labs and discussed the procedure including the risks, benefits and alternatives for the proposed anesthesia with the patient or authorized representative who has indicated his/her understanding and acceptance.     Dental advisory given  Plan Discussed with: CRNA and Surgeon  Anesthesia Plan Comments: (Discussed risks of anesthesia with patient, including possibility of difficulty with spontaneous ventilation under anesthesia necessitating airway intervention, PONV, and rare risks such as cardiac or respiratory or neurological events, and allergic reactions. Discussed the role of CRNA in patient's perioperative care. Patient understands.)       Anesthesia Quick Evaluation

## 2022-12-05 NOTE — Anesthesia Postprocedure Evaluation (Signed)
Anesthesia Post Note  Patient: Carolyn Mann  Procedure(s) Performed: COLONOSCOPY WITH PROPOFOL  Patient location during evaluation: PACU Anesthesia Type: General Level of consciousness: awake and alert, oriented and patient cooperative Pain management: pain level controlled Vital Signs Assessment: post-procedure vital signs reviewed and stable Respiratory status: spontaneous breathing, nonlabored ventilation and respiratory function stable Cardiovascular status: blood pressure returned to baseline and stable Postop Assessment: adequate PO intake Anesthetic complications: no   No notable events documented.   Last Vitals:  Vitals:   12/05/22 1510 12/05/22 1530  BP: 123/78 128/88  Pulse: 83 80  Resp: (!) 22   Temp:    SpO2: 99%     Last Pain:  Vitals:   12/05/22 1500  TempSrc: Temporal  PainSc: 0-No pain                 Darrin Nipper

## 2022-12-05 NOTE — Anesthesia Procedure Notes (Signed)
Procedure Name: MAC Date/Time: 12/05/2022 2:29 PM  Performed by: Biagio Borg, CRNAPre-anesthesia Checklist: Patient identified, Emergency Drugs available, Suction available, Patient being monitored and Timeout performed Patient Re-evaluated:Patient Re-evaluated prior to induction Oxygen Delivery Method: Nasal cannula Induction Type: IV induction Placement Confirmation: positive ETCO2 and CO2 detector

## 2022-12-05 NOTE — Interval H&P Note (Signed)
History and Physical Interval Note: Preprocedure H&P from 12/05/22  was reviewed and there was no interval change after seeing and examining the patient.  Written consent was obtained from the patient after discussion of risks, benefits, and alternatives. Patient has consented to proceed with Colonoscopy with possible intervention   12/05/2022 2:27 PM  Carolyn Mann  has presented today for surgery, with the diagnosis of Z86.010  - History of colon polyps.  The various methods of treatment have been discussed with the patient and family. After consideration of risks, benefits and other options for treatment, the patient has consented to  Procedure(s): COLONOSCOPY WITH PROPOFOL (N/A) as a surgical intervention.  The patient's history has been reviewed, patient examined, no change in status, stable for surgery.  I have reviewed the patient's chart and labs.  Questions were answered to the patient's satisfaction.     Annamaria Helling

## 2022-12-06 ENCOUNTER — Encounter: Payer: Self-pay | Admitting: Gastroenterology

## 2022-12-07 LAB — SURGICAL PATHOLOGY

## 2023-08-07 ENCOUNTER — Other Ambulatory Visit: Payer: Self-pay | Admitting: Family Medicine

## 2023-08-07 DIAGNOSIS — E2839 Other primary ovarian failure: Secondary | ICD-10-CM

## 2023-08-07 DIAGNOSIS — Z87312 Personal history of (healed) stress fracture: Secondary | ICD-10-CM

## 2023-08-11 ENCOUNTER — Other Ambulatory Visit: Payer: Self-pay | Admitting: Family Medicine

## 2023-08-11 DIAGNOSIS — Z1231 Encounter for screening mammogram for malignant neoplasm of breast: Secondary | ICD-10-CM

## 2023-08-17 ENCOUNTER — Ambulatory Visit
Admission: RE | Admit: 2023-08-17 | Discharge: 2023-08-17 | Disposition: A | Discharge status: Home / Self Care | Payer: No Typology Code available for payment source | Source: Ambulatory Visit | Attending: Family Medicine | Admitting: Family Medicine

## 2023-08-17 DIAGNOSIS — Z1231 Encounter for screening mammogram for malignant neoplasm of breast: Secondary | ICD-10-CM | POA: Diagnosis present

## 2023-08-22 ENCOUNTER — Encounter: Payer: Self-pay | Admitting: Family Medicine

## 2023-08-23 ENCOUNTER — Other Ambulatory Visit: Payer: Self-pay | Admitting: Family Medicine

## 2023-08-23 DIAGNOSIS — R928 Other abnormal and inconclusive findings on diagnostic imaging of breast: Secondary | ICD-10-CM

## 2023-08-28 ENCOUNTER — Ambulatory Visit
Admission: RE | Admit: 2023-08-28 | Discharge: 2023-08-28 | Disposition: A | Discharge status: Home / Self Care | Payer: No Typology Code available for payment source | Source: Ambulatory Visit | Attending: Family Medicine

## 2023-08-28 ENCOUNTER — Ambulatory Visit
Admission: RE | Admit: 2023-08-28 | Discharge: 2023-08-28 | Disposition: A | Discharge status: Home / Self Care | Payer: No Typology Code available for payment source | Source: Ambulatory Visit | Attending: Family Medicine | Admitting: Family Medicine

## 2023-08-28 DIAGNOSIS — R928 Other abnormal and inconclusive findings on diagnostic imaging of breast: Secondary | ICD-10-CM | POA: Insufficient documentation

## 2023-10-24 ENCOUNTER — Other Ambulatory Visit: Payer: No Typology Code available for payment source

## 2023-10-25 ENCOUNTER — Ambulatory Visit
Admission: RE | Admit: 2023-10-25 | Discharge: 2023-10-25 | Disposition: A | Discharge status: Home / Self Care | Payer: No Typology Code available for payment source | Source: Ambulatory Visit | Attending: Family Medicine | Admitting: Family Medicine

## 2023-10-25 DIAGNOSIS — Z87312 Personal history of (healed) stress fracture: Secondary | ICD-10-CM | POA: Diagnosis present

## 2023-10-25 DIAGNOSIS — E2839 Other primary ovarian failure: Secondary | ICD-10-CM | POA: Insufficient documentation

## 2024-01-24 IMAGING — MG MM DIGITAL SCREENING BILAT W/ TOMO AND CAD
6 of 10 series · 6 of 30 positions shown · non-contrast
Comparison: Previous exam(s).

CLINICAL DATA: Screening.

EXAM:
DIGITAL SCREENING BILATERAL MAMMOGRAM WITH TOMOSYNTHESIS AND CAD
TECHNIQUE: Bilateral screening digital craniocaudal and mediolateral oblique
mammograms were obtained. Bilateral screening digital breast
tomosynthesis was performed. The images were evaluated with
computer-aided detection.

[R MLO synth-2D]
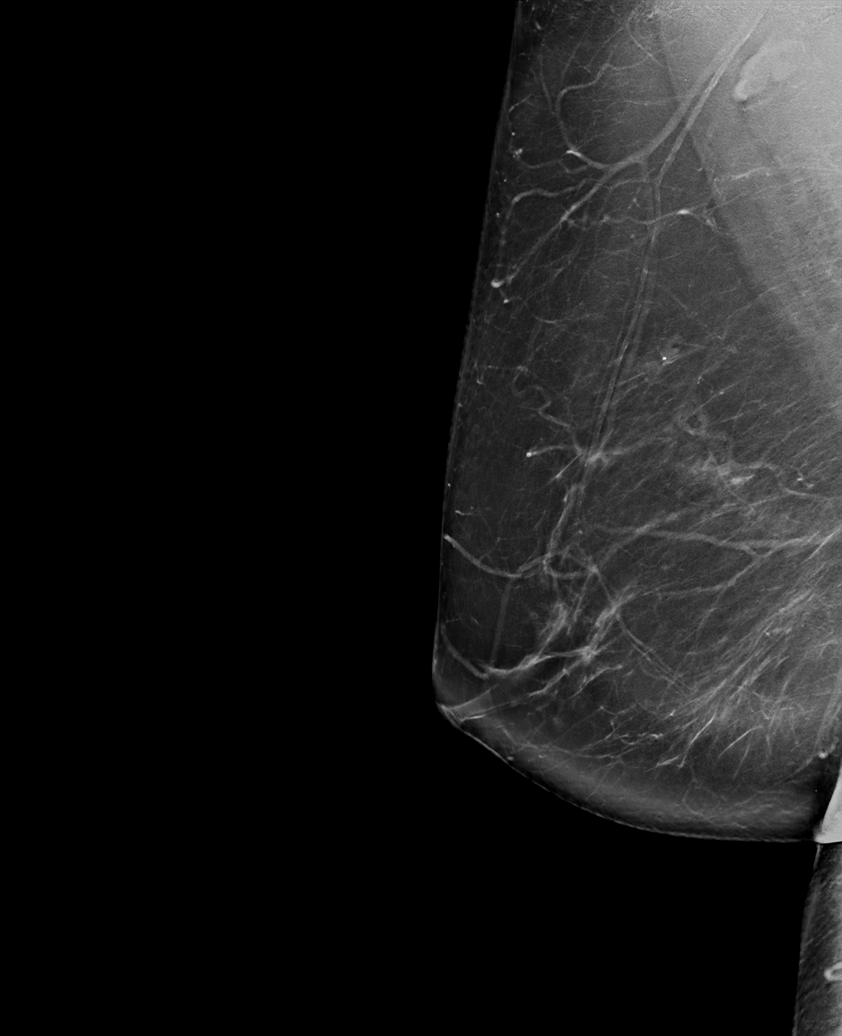

[L MLO synth-2D (1 of 2)]
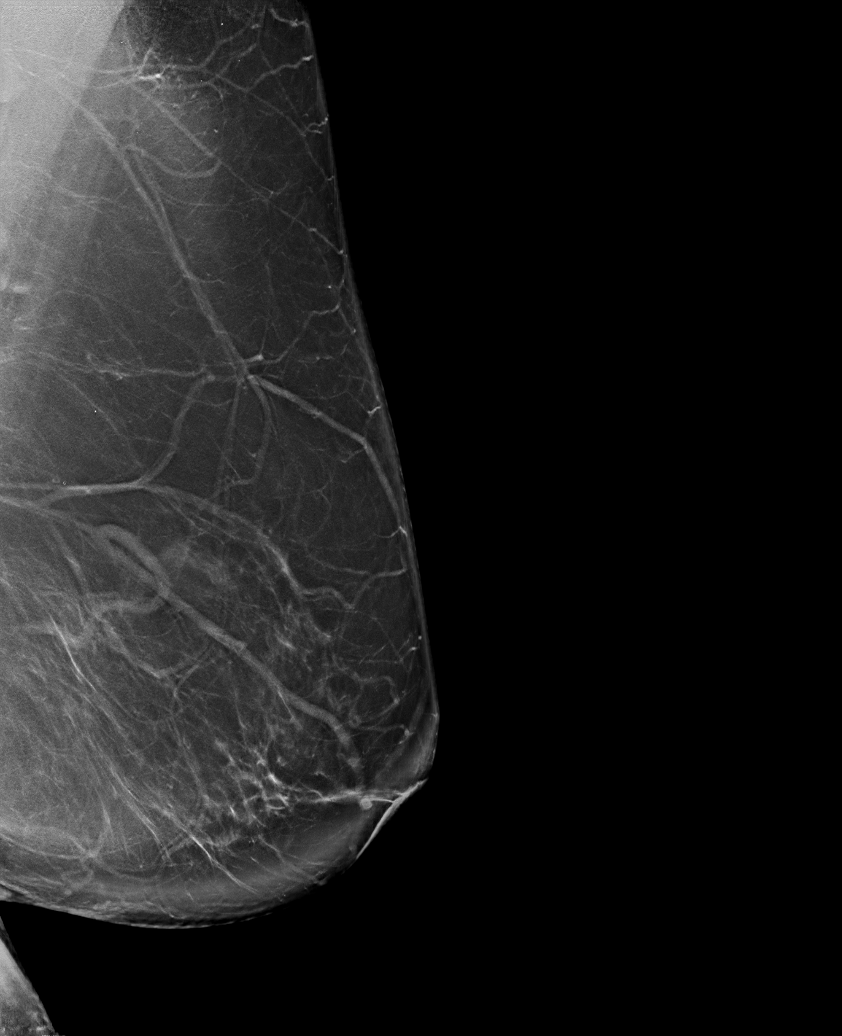

[L MLO synth-2D (2 of 2)]
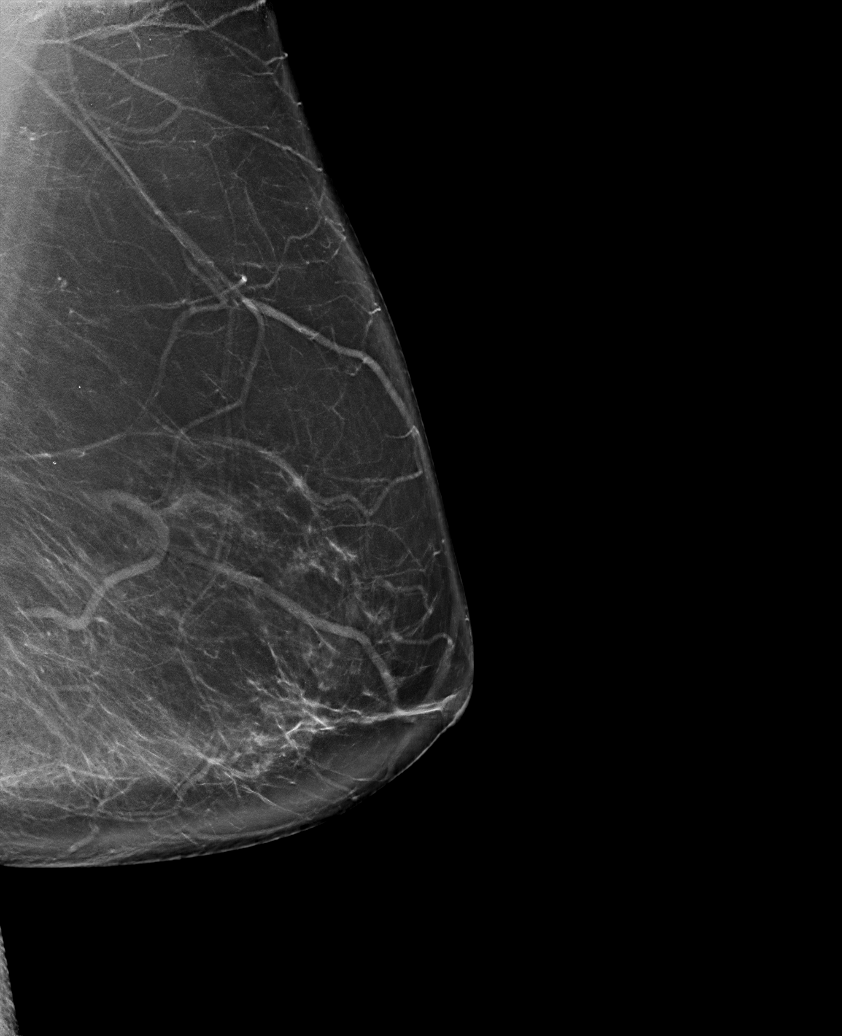

[R CC synth-2D]
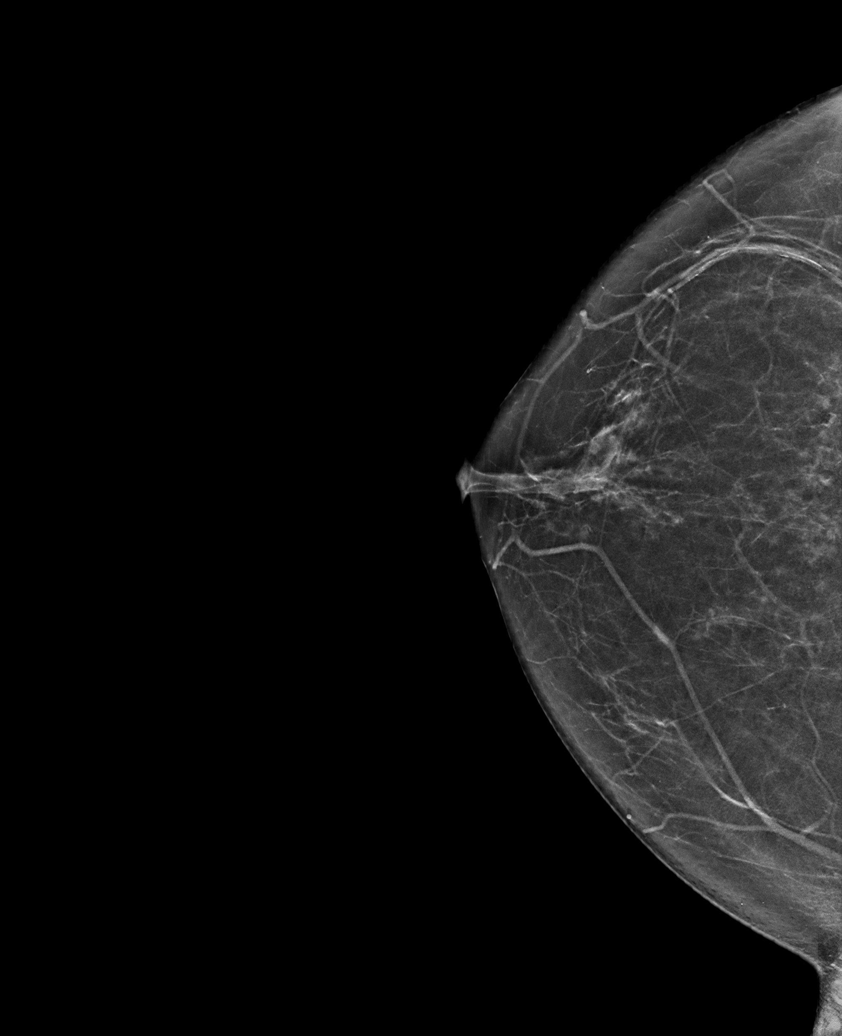

[L CC synth-2D]
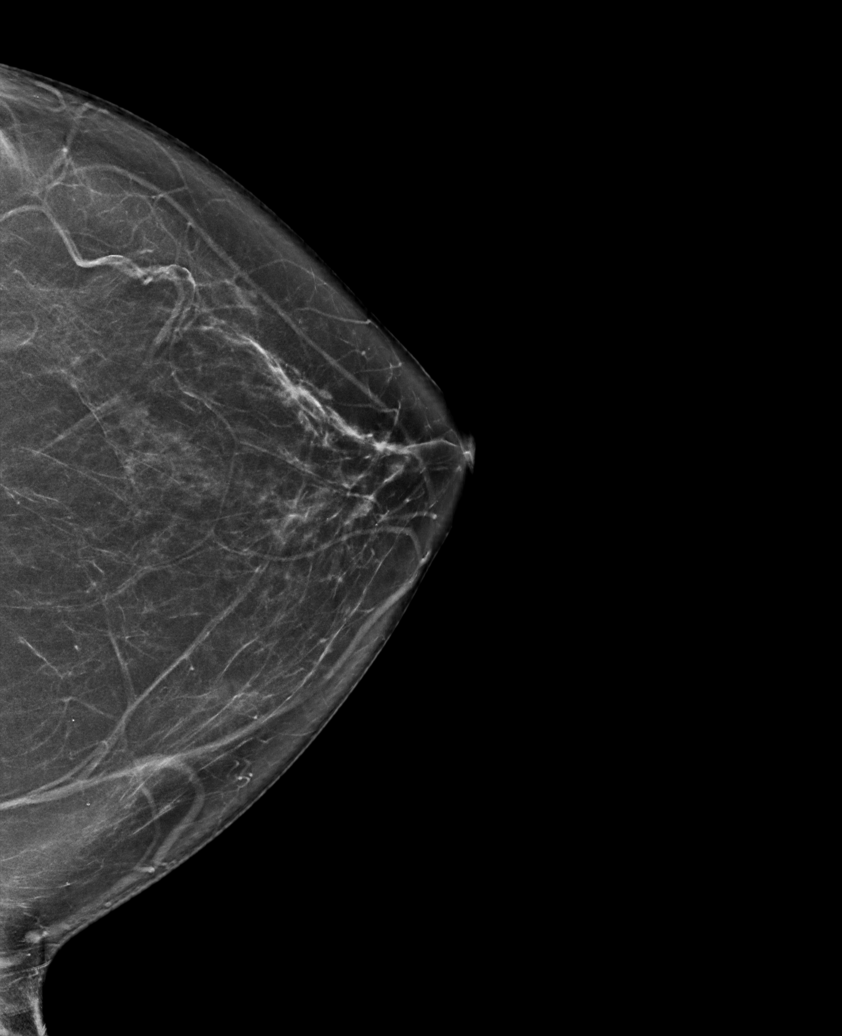

[R MLO tomo · tomo slice 48/95.0]
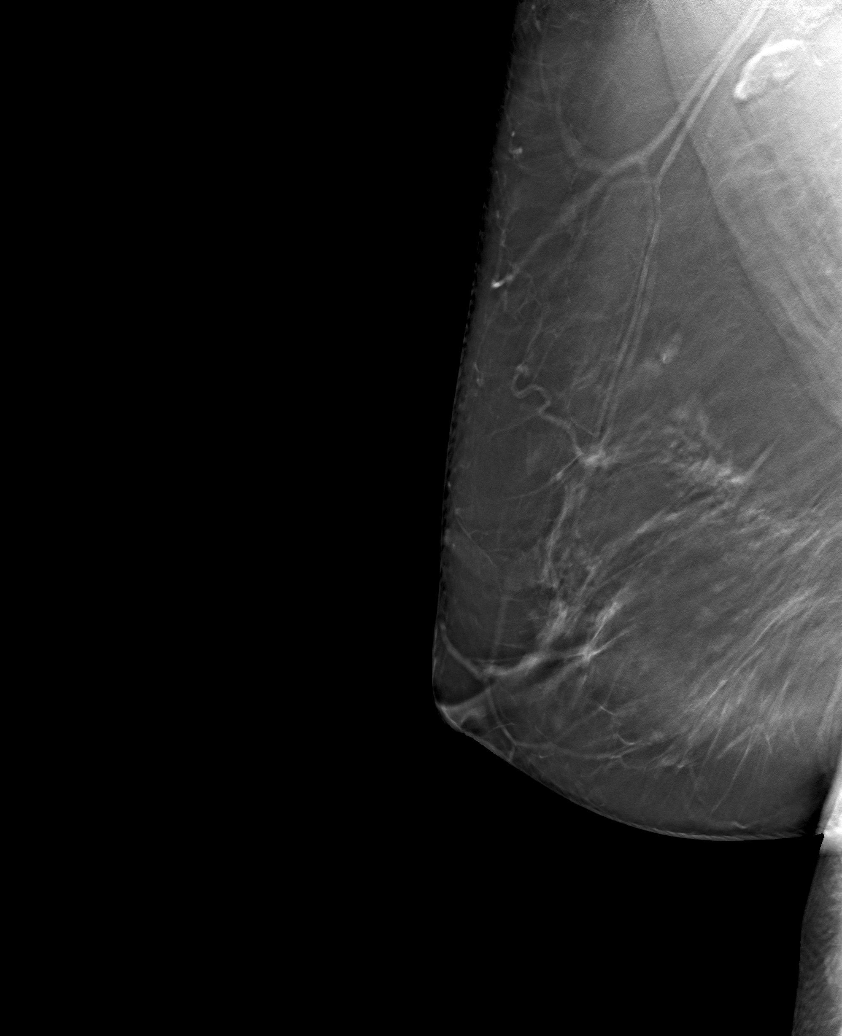

[6 of 30 positions shown; findings below may reference images not displayed]

ACR Breast Density Category b: There are scattered areas of
fibroglandular density.
FINDINGS: There are no findings suspicious for malignancy.
IMPRESSION: No mammographic evidence of malignancy. A result letter of this
screening mammogram will be mailed directly to the patient.

RECOMMENDATION:
Screening mammogram in one year. (Code:51-O-LD2)

BI-RADS CATEGORY  1: Negative.

## 2024-08-04 DIAGNOSIS — L237 Allergic contact dermatitis due to plants, except food: Secondary | ICD-10-CM | POA: Diagnosis not present

## 2024-08-09 DIAGNOSIS — I10 Essential (primary) hypertension: Secondary | ICD-10-CM | POA: Diagnosis not present

## 2024-08-09 DIAGNOSIS — R7303 Prediabetes: Secondary | ICD-10-CM | POA: Diagnosis not present

## 2024-08-09 DIAGNOSIS — Z0000 Encounter for general adult medical examination without abnormal findings: Secondary | ICD-10-CM | POA: Diagnosis not present

## 2024-08-09 DIAGNOSIS — L255 Unspecified contact dermatitis due to plants, except food: Secondary | ICD-10-CM | POA: Diagnosis not present

## 2024-08-28 ENCOUNTER — Other Ambulatory Visit: Payer: Self-pay | Admitting: Family Medicine

## 2024-08-28 DIAGNOSIS — Z1231 Encounter for screening mammogram for malignant neoplasm of breast: Secondary | ICD-10-CM

## 2024-09-30 ENCOUNTER — Ambulatory Visit
Admission: RE | Admit: 2024-09-30 | Discharge: 2024-09-30 | Disposition: A | Discharge status: Home / Self Care | Source: Ambulatory Visit | Attending: Family Medicine | Admitting: Family Medicine

## 2024-09-30 DIAGNOSIS — Z1231 Encounter for screening mammogram for malignant neoplasm of breast: Secondary | ICD-10-CM | POA: Insufficient documentation
